# Patient Record
Sex: Male | Born: 1990 | Race: White | Hispanic: No | Marital: Single | Smoking: Never smoker
Health system: Southern US, Community
[De-identification: ages and names within clinical notes are randomized; demographics above are authoritative.]

## PROBLEM LIST (undated history)

## (undated) DIAGNOSIS — S82202A Unspecified fracture of shaft of left tibia, initial encounter for closed fracture: Secondary | ICD-10-CM

## (undated) DIAGNOSIS — S62102A Fracture of unspecified carpal bone, left wrist, initial encounter for closed fracture: Secondary | ICD-10-CM

## (undated) HISTORY — DX: Unspecified fracture of shaft of left tibia, initial encounter for closed fracture: S82.202A

## (undated) HISTORY — DX: Fracture of unspecified carpal bone, left wrist, initial encounter for closed fracture: S62.102A

---

## 2013-07-25 ENCOUNTER — Encounter: Payer: Self-pay | Admitting: Nurse Practitioner

## 2013-07-25 ENCOUNTER — Ambulatory Visit (INDEPENDENT_AMBULATORY_CARE_PROVIDER_SITE_OTHER): Payer: BC Managed Care – PPO | Admitting: Nurse Practitioner

## 2013-07-25 VITALS — BP 120/80 | HR 65 | Temp 97.4°F | Ht 69.5 in | Wt 157.0 lb

## 2013-07-25 DIAGNOSIS — M238X1 Other internal derangements of right knee: Secondary | ICD-10-CM

## 2013-07-25 DIAGNOSIS — Z0000 Encounter for general adult medical examination without abnormal findings: Secondary | ICD-10-CM

## 2013-07-25 DIAGNOSIS — R29898 Other symptoms and signs involving the musculoskeletal system: Secondary | ICD-10-CM

## 2013-07-25 DIAGNOSIS — Z23 Encounter for immunization: Secondary | ICD-10-CM

## 2013-07-25 DIAGNOSIS — R1011 Right upper quadrant pain: Secondary | ICD-10-CM

## 2013-07-25 LAB — RENAL FUNCTION PANEL
CO2: 31 mEq/L (ref 19–32)
Calcium: 9.4 mg/dL (ref 8.4–10.5)
Creatinine, Ser: 0.9 mg/dL (ref 0.4–1.5)
GFR: 108.91 mL/min (ref >60.00)
Glucose, Bld: 81 mg/dL (ref 70–99)
Phosphorus: 2.9 mg/dL (ref 2.3–4.6)
Potassium: 3.9 mEq/L (ref 3.5–5.1)
Sodium: 138 mEq/L (ref 135–145)

## 2013-07-25 LAB — HEPATIC FUNCTION PANEL
ALT: 24 U/L (ref 0–53)
AST: 16 U/L (ref 0–37)
Bilirubin, Direct: 0.2 mg/dL (ref 0.0–0.3)
Total Bilirubin: 2.7 mg/dL — ABNORMAL HIGH (ref 0.3–1.2)

## 2013-07-25 LAB — CBC
HCT: 41.2 % (ref 39.0–52.0)
MCV: 87.8 fl (ref 78.0–100.0)
Platelets: 214 10*3/uL (ref 150.0–400.0)
RBC: 4.7 Mil/uL (ref 4.22–5.81)
RDW: 12.4 % (ref 11.5–14.6)

## 2013-07-25 MED ORDER — TETANUS-DIPHTH-ACELL PERTUSSIS 5-2.5-18.5 LF-MCG/0.5 IM SUSP: 0.5000 mL | Freq: Once | INTRAMUSCULAR | Status: DC

## 2013-07-25 NOTE — Progress Notes (Signed)
Subjective:     Vincent May is a 22 y.o. male and is here for a comprehensive physical exam. The patient reports RLQ abd pain about 3 mo. Ago that was the second episode-the first one occuring 9 mos ago w/sudden onset and lasting about 24 hours. He denies associated symptoms such as nausea, diarrhea, gas, belching, heartburn, or constipation. Additionally, he c/o crepitus in R knee & pain w/walking downhill. He would like to be screened for HIV as his brother's girlfriend lived with his family 2 years before she died of complications of HIV.  History   Social History  . Marital Status: Single    Spouse Name: N/A    Number of Children: N/A  . Years of Education: N/A   Occupational History  . Not on file.   Social History Main Topics  . Smoking status: Never Smoker   . Smokeless tobacco: Never Used  . Alcohol Use: Yes  . Drug Use: No  . Sexual Activity: Not on file   Other Topics Concern  . Not on file   Social History Narrative  . No narrative on file   No health maintenance topics applied.  The following portions of the patient's history were reviewed and updated as appropriate: allergies, current medications, past family history, past medical history, past social history, past surgical history and problem list.  Review of Systems Constitutional: negative Eyes: positive for contacts/glasses and had eye exam 1 mo ago. Ears, nose, mouth, throat, and face: reports multiple cavities, a root canal Respiratory: negative Cardiovascular: negative Gastrointestinal: positive for remote history of abd pain, noted in HPI, negative for change in bowel habits, constipation, diarrhea, dyspepsia, melena, nausea, reflux symptoms and vomiting Integument/breast: spends a lot of time in sun. Musculoskeletal:positive for crepitus in R knee, pain w/walking downhill Neurological: negative Behavioral/Psych: negative for excessive alcohol consumption, illegal drug usage, sleep disturbance and  tobacco use Endocrine: negative Allergic/Immunologic: negative   Objective:    BP 120/80  Pulse 65  Temp(Src) 97.4 F (36.3 C) (Oral)  Ht 5' 9.5" (1.765 m)  Wt 157 lb (71.215 kg)  BMI 22.86 kg/m2  SpO2 98% General appearance: alert, cooperative, appears stated age and no distress Head: Normocephalic, without obvious abnormality, atraumatic Eyes: negative findings: lids and lashes normal, conjunctivae and sclerae normal, corneas clear and pupils equal, round, reactive to light and accomodation Ears: normal TM's and external ear canals both ears and cerumen in R canal Throat: lips, mucosa, and tongue normal; teeth and gums normal and missing a molar Lower L Neck: no adenopathy, no carotid bruit, supple, symmetrical, trachea midline and thyroid not enlarged, symmetric, no tenderness/mass/nodules Back: symmetric, no curvature. ROM normal. No CVA tenderness. Lungs: clear to auscultation bilaterally Heart: regular rate and rhythm, S1, S2 normal, no murmur, click, rub or gallop Abdomen: soft, non-tender; bowel sounds normal; no masses,  no organomegaly Extremities: extremities normal, atraumatic, no cyanosis or edema Pulses: 2+ and symmetric Skin: Skin color, texture, turgor normal. No rashes or lesions Lymph nodes: Cervical, supraclavicular, and axillary nodes normal. Neurologic: Alert and oriented X 3, normal strength and tone. Normal symmetric reflexes. Normal coordination and gait    Assessment:    Healthy male exam. Prev care Needs flu & Tdap, screening labs. Traveling to [place] in 2 mos. Crepitus R knee, no laxity in joint Remote Hx recurrent abd pain X 2     Plan:    1 Tdap & flu administered. HIV screen. Consult CDC for travel info. Will call pt.  2 ref to ortho 3 CBC, renal, hep, ua micro  See After Visit Summary for Counseling Recommendations

## 2013-07-25 NOTE — Patient Instructions (Addendum)
Our office will call you with results of labs. Please let us know if abdominal pain returns or you need [place] for other symptoms. Otherwise we will see you again for preventive care 2-5 years.   Preventive Care for Adults, Male A healthy lifestyle and preventive care can promote health and wellness. Preventive health guidelines for men include the following key practices:  A routine yearly physical is a good way to check with your caregiver about your health and preventative screening. It is a chance to share any concerns and updates on your health, and to receive a thorough exam.  Visit your dentist for a routine exam and preventative care every 6 months. Brush your teeth twice a day and floss once a day. Good oral hygiene prevents tooth decay and gum disease.  The frequency of eye exams is based on your age, health, family medical history, use of contact lenses, and other factors. Follow your caregiver's recommendations for frequency of eye exams.  Eat a healthy diet. Foods like vegetables, fruits, whole grains, low-fat dairy products, and lean protein foods contain the nutrients you need without too many calories. Decrease your intake of foods high in solid fats, added sugars, and salt. Eat the right amount of calories for you.Get information about a proper diet from your caregiver, if necessary.  Regular physical exercise is one of the most important things you can do for your health. Most adults should get at least 150 minutes of moderate-intensity exercise (any activity that increases your heart rate and causes you to sweat) each week. In addition, most adults need muscle-strengthening exercises on 2 or more days a week.  Maintain a healthy weight. The body mass index (BMI) is a screening tool to identify possible weight problems. It provides an estimate of body fat based on height and weight. Your caregiver can help determine your BMI, and can help you achieve or maintain a healthy weight.For  adults 20 years and older:  A BMI below 18.5 is considered underweight.  A BMI of 18.5 to 24.9 is normal.  A BMI of 25 to 29.9 is considered overweight.  A BMI of 30 and above is considered obese.  Maintain normal blood lipids and cholesterol levels by exercising and minimizing your intake of saturated fat. Eat a balanced diet with plenty of fruit and vegetables. Blood tests for lipids and cholesterol should begin at age 2 and be repeated every 5 years. If your lipid or cholesterol levels are high, you are over 50, or you are a high risk for heart disease, you may need your cholesterol levels checked more frequently.Ongoing high lipid and cholesterol levels should be treated with medicines if diet and exercise are not effective.  If you smoke, find out from your caregiver how to quit. If you do not use tobacco, do not start.  If you choose to drink alcohol, do not exceed 2 drinks per day. One drink is considered to be 12 ounces (355 mL) of beer, 5 ounces (148 mL) of wine, or 1.5 ounces (44 mL) of liquor.  Avoid use of street drugs. Do not share needles with anyone. Ask for help if you need support or instructions about stopping the use of drugs.  High blood pressure causes heart disease and increases the risk of stroke. Your blood pressure should be checked at least every 1 to 2 years. Ongoing high blood pressure should be treated with medicines, if weight loss and exercise are not effective.  If you are 45 to 24  years old, ask your caregiver if you should take aspirin to prevent heart disease.  Diabetes screening involves taking a blood sample to check your fasting blood sugar level. This should be done once every 3 years, after age 59, if you are within normal weight and without risk factors for diabetes. Testing should be considered at a younger age or be carried out more frequently if you are overweight and have at least 1 risk factor for diabetes.  Colorectal cancer can be detected and  often prevented. Most routine colorectal cancer screening begins at the age of 32 and continues through age 44. However, your caregiver may recommend screening at an earlier age if you have risk factors for colon cancer. On a yearly basis, your caregiver may provide home test kits to check for hidden blood in the stool. Use of a small camera at the end of a tube, to directly examine the colon (sigmoidoscopy or colonoscopy), can detect the earliest forms of colorectal cancer. Talk to your caregiver about this at age 52, when routine screening begins. Direct examination of the colon should be repeated every 5 to 10 years through age 41, unless early forms of pre-cancerous polyps or small growths are found.  Hepatitis C blood testing is recommended for all people born from [birth year] through 1965 and any individual with known risks for hepatitis C.  Practice safe sex. Use condoms and avoid high-risk sexual practices to reduce the spread of sexually transmitted infections (STIs). STIs include gonorrhea, chlamydia, syphilis, trichomonas, herpes, HPV, and human immunodeficiency virus (HIV). Herpes, HIV, and HPV are viral illnesses that have no cure. They can result in disability, cancer, and death.  A one-time screening for abdominal aortic aneurysm (AAA) and surgical repair of large AAAs by sound wave imaging (ultrasonography) is recommended for ages 12 to 65 years who are current or former smokers.  Healthy men should no longer receive prostate-specific antigen (PSA) blood tests as part of routine cancer screening. Consult with your caregiver about prostate cancer screening.  Testicular cancer screening is not recommended for adult males who have no symptoms. Screening includes self-exam, caregiver exam, and other screening tests. Consult with your caregiver about any symptoms you have or any concerns you have about testicular cancer.  Use sunscreen with skin protection factor (SPF) of 30 or more. Apply sunscreen  liberally and repeatedly throughout the day. You should seek shade when your shadow is shorter than you. Protect yourself by wearing long sleeves, pants, a wide-brimmed hat, and sunglasses year round, whenever you are outdoors.  Once a month, do a whole body skin exam, using a mirror to look at the skin on your back. Notify your caregiver of new moles, moles that have irregular borders, moles that are larger than a pencil eraser, or moles that have changed in shape or color.  Stay current with required immunizations.  Influenza. You need a dose every fall (or winter). The composition of the flu vaccine changes each year, so being vaccinated once is not enough.  Pneumococcal polysaccharide. You need 1 to 2 doses if you smoke cigarettes or if you have certain chronic medical conditions. You need 1 dose at age 21 (or older) if you have never been vaccinated.  Tetanus, diphtheria, pertussis (Tdap, Td). Get 1 dose of Tdap vaccine if you are younger than age 71 years, are over 52 and have contact with an infant, are a Research scientist (physical sciences), or simply want to be protected from whooping cough. After that, you need a Td  booster dose every 10 years. Consult your caregiver if you have not had at least 3 tetanus and diphtheria-containing shots sometime in your life or have a deep or dirty wound.  HPV. This vaccine is recommended for males 13 through 24 years of age. This vaccine may be given to men 22 through 24 years of age who have not completed the 3 dose series. It is recommended for men through age 34 who have sex with men or whose immune system is weakened because of HIV infection, other illness, or medications. The vaccine is given in 3 doses over 6 months.  Measles, mumps, rubella (MMR). You need at least 1 dose of MMR if you were born in 1957 or later. You may also need a 2nd dose.  Meningococcal. If you are age 71 to 63 years and a Orthoptist living in a residence hall, or have one of  several medical conditions, you need to get vaccinated against meningococcal disease. You may also need additional booster doses.  Zoster (shingles). If you are age 51 years or older, you should get this vaccine.  Varicella (chickenpox). If you have never had chickenpox or you were vaccinated but received only 1 dose, talk to your caregiver to find out if you need this vaccine.  Hepatitis A. You need this vaccine if you have a specific risk factor for hepatitis A virus infection, or you simply wish to be protected from this disease. The vaccine is usually given as 2 doses, 6 to 18 months apart.  Hepatitis B. You need this vaccine if you have a specific risk factor for hepatitis B virus infection or you simply wish to be protected from this disease. The vaccine is given in 3 doses, usually over 6 months. Preventative Service / Frequency Ages 38 to 38  Blood pressure check.** / Every 1 to 2 years.  Lipid and cholesterol check.** / Every 5 years beginning at age 79.  Hepatitis C blood test.** / For any individual with known risks for hepatitis C.  Skin self-exam. / Monthly.  Influenza immunization.** / Every year.  Pneumococcal polysaccharide immunization.** / 1 to 2 doses if you smoke cigarettes or if you have certain chronic medical conditions.  Tetanus, diphtheria, pertussis (Tdap,Td) immunization. / A one-time dose of Tdap vaccine. After that, you need a Td booster dose every 10 years.  HPV immunization. / 3 doses over 6 months, if 26 and younger.  Measles, mumps, rubella (MMR) immunization. / You need at least 1 dose of MMR if you were born in 1957 or later. You may also need a 2nd dose.  Meningococcal immunization. / 1 dose if you are age 23 to 5 years and a Orthoptist living in a residence hall, or have one of several medical conditions, you need to get vaccinated against meningococcal disease. You may also need additional booster doses.  Varicella immunization.**  / Consult your caregiver.  Hepatitis A immunization.** / Consult your caregiver. 2 doses, 6 to 18 months apart.  Hepatitis B immunization.** / Consult your caregiver. 3 doses usually over 6 months. Ages 58 to 20  Blood pressure check.** / Every 1 to 2 years.  Lipid and cholesterol check.** / Every 5 years beginning at age 44.  Fecal occult blood test (FOBT) of stool. / Every year beginning at age 44 and continuing until age 29. You may not have to do this test if you get colonoscopy every 10 years.  Flexible sigmoidoscopy** or colonoscopy.** / Every 5  years for a flexible sigmoidoscopy or every 10 years for a colonoscopy beginning at age 90 and continuing until age 29.  Hepatitis C blood test.** / For all people born from [birth year] through 1965 and any individual with known risks for hepatitis C.  Skin self-exam. / Monthly.  Influenza immunization.** / Every year.  Pneumococcal polysaccharide immunization.** / 1 to 2 doses if you smoke cigarettes or if you have certain chronic medical conditions.  Tetanus, diphtheria, pertussis (Tdap/Td) immunization.** / A one-time dose of Tdap vaccine. After that, you need a Td booster dose every 10 years.  Measles, mumps, rubella (MMR) immunization. / You need at least 1 dose of MMR if you were born in 1957 or later. You may also need a 2nd dose.  Varicella immunization.**/ Consult your caregiver.  Meningococcal immunization.** / Consult your caregiver.  Hepatitis A immunization.** / Consult your caregiver. 2 doses, 6 to 18 months apart.  Hepatitis B immunization.** / Consult your caregiver. 3 doses, usually over 6 months. Ages 15 and over  Blood pressure check.** / Every 1 to 2 years.  Lipid and cholesterol check.**/ Every 5 years beginning at age 63.  Fecal occult blood test (FOBT) of stool. / Every year beginning at age 40 and continuing until age 22. You may not have to do this test if you get colonoscopy every 10 years.  Flexible  sigmoidoscopy** or colonoscopy.** / Every 5 years for a flexible sigmoidoscopy or every 10 years for a colonoscopy beginning at age 25 and continuing until age 49.  Hepatitis C blood test.** / For all people born from [birth year] through 1967 and any individual with known risks for hepatitis C.  Abdominal aortic aneurysm (AAA) screening.** / A one-time screening for ages 57 to 52 years who are current or former smokers.  Skin self-exam. / Monthly.  Influenza immunization.** / Every year.  Pneumococcal polysaccharide immunization.** / 1 dose at age 81 (or older) if you have never been vaccinated.  Tetanus, diphtheria, pertussis (Tdap, Td) immunization. / A one-time dose of Tdap vaccine if you are over 65 and have contact with an infant, are a Research scientist (physical sciences), or simply want to be protected from whooping cough. After that, you need a Td booster dose every 10 years.  Varicella immunization. ** / Consult your caregiver.  Meningococcal immunization.** / Consult your caregiver.  Hepatitis A immunization. ** / Consult your caregiver. 2 doses, 6 to 18 months apart.  Hepatitis B immunization.** / Check with your caregiver. 3 doses, usually over 6 months. **Family history and personal history of risk and conditions may change your caregiver's recommendations. Document Released: 12/12/2001 Document Revised: 01/08/2012 Document Reviewed: 03/13/2011 [facility] Patient Information 2014 [place], [place].

## 2013-07-26 LAB — URINALYSIS, MICROSCOPIC ONLY: Squamous Epithelial / LPF: NONE SEEN

## 2013-07-28 ENCOUNTER — Telehealth: Payer: Self-pay | Admitting: Nurse Practitioner

## 2013-07-28 ENCOUNTER — Ambulatory Visit (HOSPITAL_BASED_OUTPATIENT_CLINIC_OR_DEPARTMENT_OTHER)
Admission: RE | Admit: 2013-07-28 | Discharge: 2013-07-28 | Disposition: A | Discharge status: Home / Self Care | Payer: BC Managed Care – PPO | Source: Ambulatory Visit | Attending: Nurse Practitioner | Admitting: Nurse Practitioner

## 2013-07-28 DIAGNOSIS — R17 Unspecified jaundice: Secondary | ICD-10-CM | POA: Insufficient documentation

## 2013-07-28 DIAGNOSIS — R1011 Right upper quadrant pain: Secondary | ICD-10-CM | POA: Insufficient documentation

## 2013-07-28 NOTE — Telephone Encounter (Signed)
Patient notified of results.

## 2013-07-28 NOTE — Telephone Encounter (Signed)
Patient is scheduled for [place] today 07/28/13. Patient has been advised. I also advised patient that we were checking on which vaccines he would need to travel. Marcelino Duster please give patient Vincent May travel clinic phone # [phone] when you contact him.

## 2013-07-28 NOTE — Telephone Encounter (Signed)
LMOVM for patient to return call.

## 2013-07-28 NOTE — Telephone Encounter (Signed)
Elevated bilirubin, pt c/o RUQ pain, intermittent for 8 mos. Will order [place] for GB, cystic duct, CBD, & liver eval. Pt will travel to [place], recmd Hep A and typhoid vaccines. May need malaria meds if traveling to [place] of [place], per [employer] travel info. HIV neg, CBC, liver & kidney func., & UA micro nml. Should check lipids as bilirubin elevated.

## 2013-07-30 ENCOUNTER — Telehealth: Payer: Self-pay | Admitting: Nurse Practitioner

## 2013-07-30 DIAGNOSIS — R109 Unspecified abdominal pain: Secondary | ICD-10-CM

## 2013-07-30 NOTE — Telephone Encounter (Signed)
Pt reports recent episode of pain after eating, episode lasting 48 hours. Appendicitis? Recent upper abd [place] nml, elevated bilirubin. Will ref to GI for further work up.

## 2013-07-30 NOTE — Telephone Encounter (Signed)
Message copied by Maximino Sarin [facility] on Wed Jul 30, 2013  9:32 PM ------      Message from: Marlene Lard      Created: Wed Jul 30, 2013  9:28 AM       Patient notified of results. Patient stated that Sunday night 07/27/13, he had pizza and later that night had had severe pain in his upper stomach that traveled down his right side. Patient stated that the pain stay in his lower right side and hip area until yesterday 07/29/13. Patient wanted me to let you know about this episode. ------

## 2013-07-31 NOTE — Telephone Encounter (Signed)
Patient returned call and was notified of GI referral. Patient stated that he will be out of town until 08/05/13.

## 2013-07-31 NOTE — Telephone Encounter (Signed)
Called patient to let him know that he is being referred to GI. LMOVM for patient to return call.

## 2013-08-29 ENCOUNTER — Encounter: Payer: BC Managed Care – PPO | Admitting: Internal Medicine

## 2013-08-29 ENCOUNTER — Encounter: Payer: Self-pay | Admitting: Family Medicine

## 2013-08-29 ENCOUNTER — Ambulatory Visit (INDEPENDENT_AMBULATORY_CARE_PROVIDER_SITE_OTHER): Payer: BC Managed Care – PPO | Admitting: Family Medicine

## 2013-08-29 VITALS — BP 137/77 | HR 60 | Temp 97.8°F | Resp 16 | Ht 69.5 in | Wt 158.0 lb

## 2013-08-29 DIAGNOSIS — Z23 Encounter for immunization: Secondary | ICD-10-CM

## 2013-08-29 DIAGNOSIS — Z7189 Other specified counseling: Secondary | ICD-10-CM

## 2013-08-29 DIAGNOSIS — Z7184 Encounter for health counseling related to travel: Secondary | ICD-10-CM

## 2013-08-29 MED ORDER — TYPHOID VACCINE PO CPDR: DELAYED_RELEASE_CAPSULE | ORAL | Status: DC

## 2013-08-29 NOTE — Progress Notes (Signed)
OFFICE NOTE  08/29/2013  CC:  Chief Complaint  Patient presents with  . Immunizations     HPI: Patient is a 22 y.o. Caucasian male who is here for traveler's health guidance visit. He established care with [place] through NP Maximino Sarin and this is the first time I'm seeing him. He is getting married in November '14 and going to [place] in December 2014.  He is feeling well, no complaints.  Pertinent PMH:  No past medical history on file.  No past surgical history on file.  History   Social History Narrative   Pt lives in [place] with fiance's parents.   He is engaged-will be getting married in few months.    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   Originally from [place], relocated 2009 to go to [facility] [place].   No T/A/Ds.          MEDS:  NONE  PE: Blood pressure 137/77, pulse 60, temperature 97.8 F (36.6 C), temperature source Temporal, resp. rate 16, height 5' 9.5" (1.765 m), weight 158 lb (71.668 kg), SpO2 100.00%. Gen: Alert, well appearing.  Patient is oriented to person, place, time, and situation. No further exam today.  IMPRESSION AND PLAN:  Healthy traveler: reviewed CDC's recommendations regarding travel to [place] with pt today. He is UTD on flu and Tdap. He got Hep A #1 today, needs #2 in 6 mo to be fully vaccinated. Typhoid vaccine caps rx'd today. Avoidance of mosquitos/bites is recommended--CDC does not recommend malaria prophylaxis or yellow fever vaccine.  FOLLOW UP: prn

## 2013-09-29 ENCOUNTER — Encounter: Payer: Self-pay | Admitting: Family Medicine

## 2014-05-25 IMAGING — US US ABDOMEN COMPLETE
1 series · 14 of 25 positions shown · non-contrast
Comparison: None.

CLINICAL DATA: Intermittent right upper quadrant pain for 8 months
with elevated bilirubin

ABDOMINAL ULTRASOUND COMPLETE

[Series 1: us abdomen complete · 0.28mm/px · 14 of 85 slices shown]
[im 1/85]
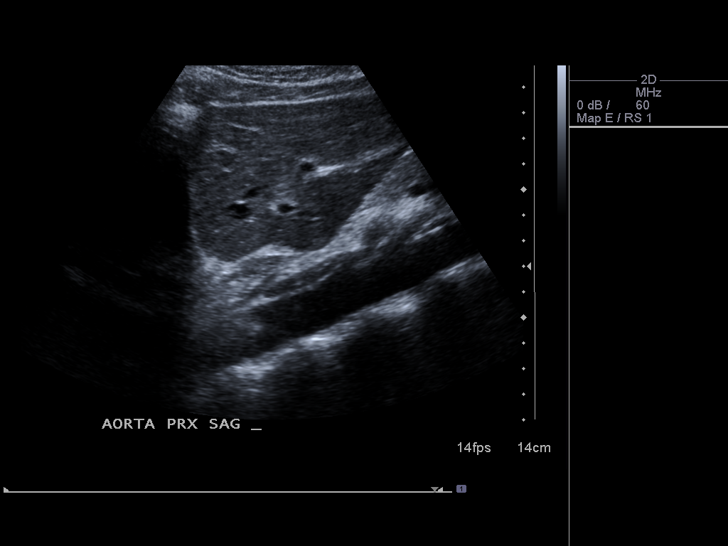
[im 8/85]
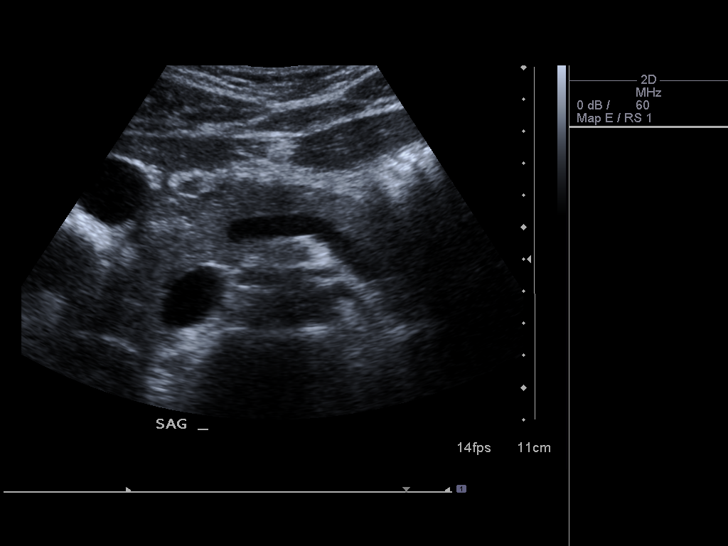
[im 15/85]
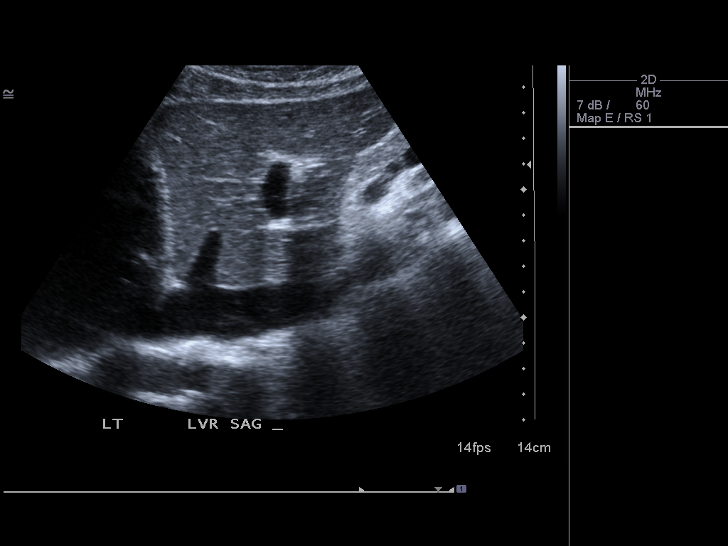
[im 22/85]
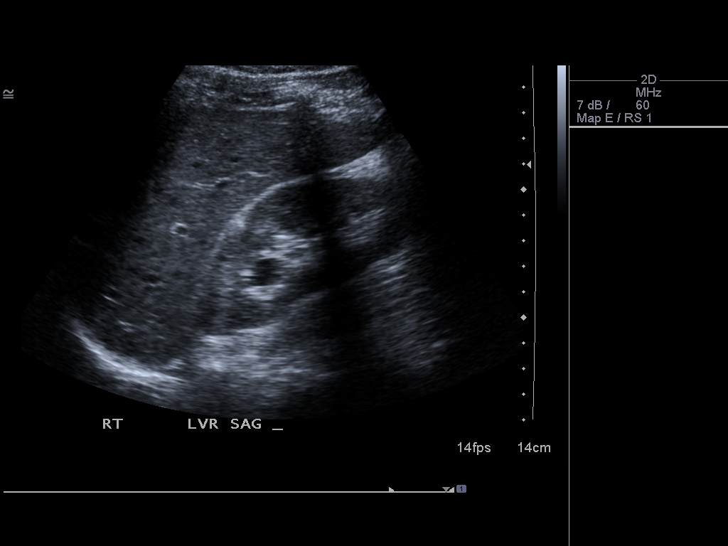
[im 29/85]
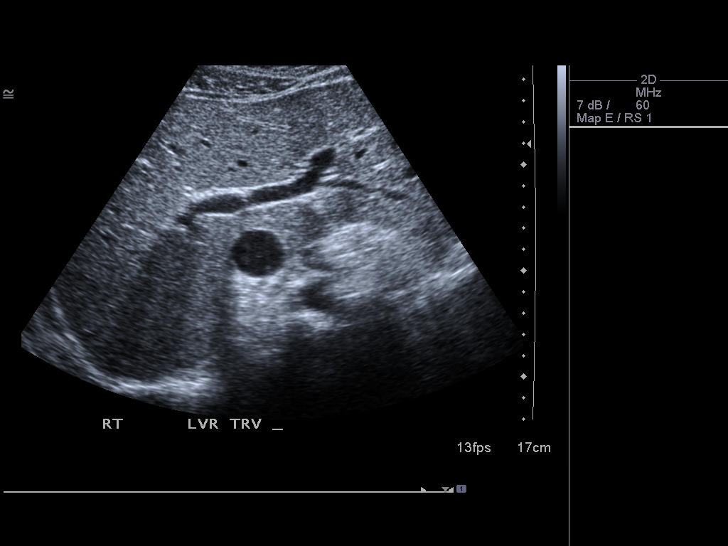
[im 32/85]
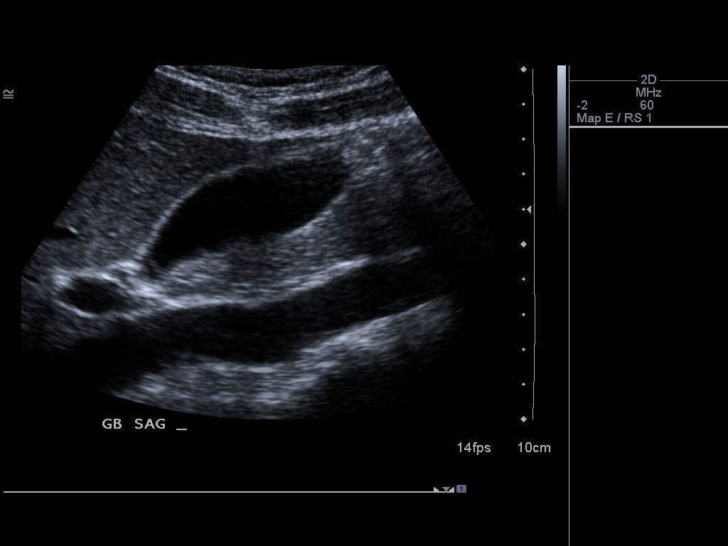
[im 39/85]
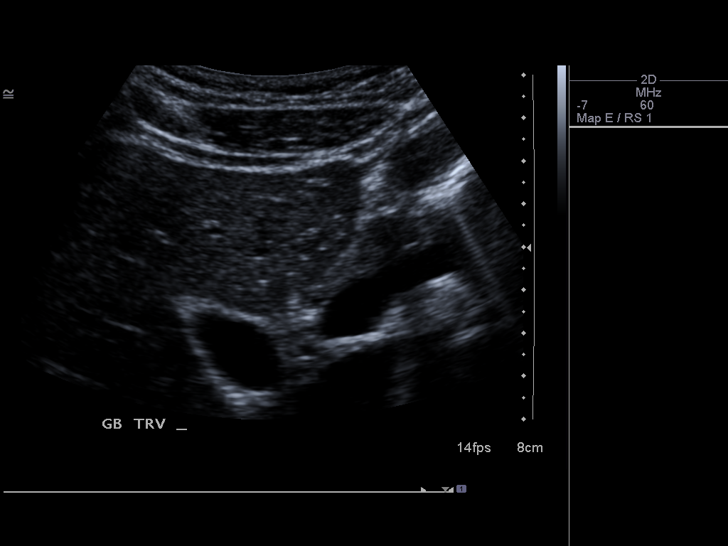
[im 46/85]
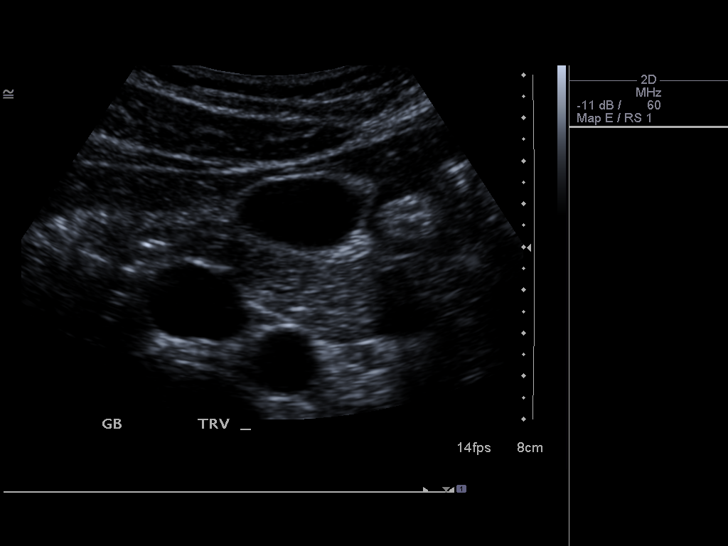
[im 53/85]
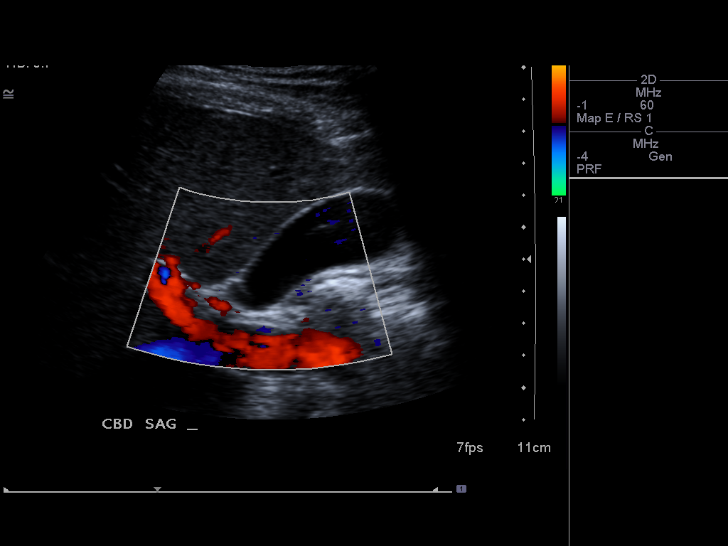
[im 57/85]
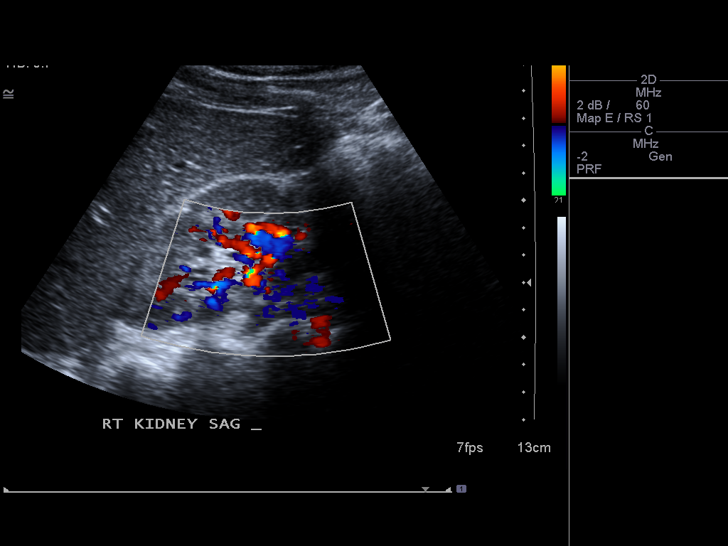
[im 64/85]
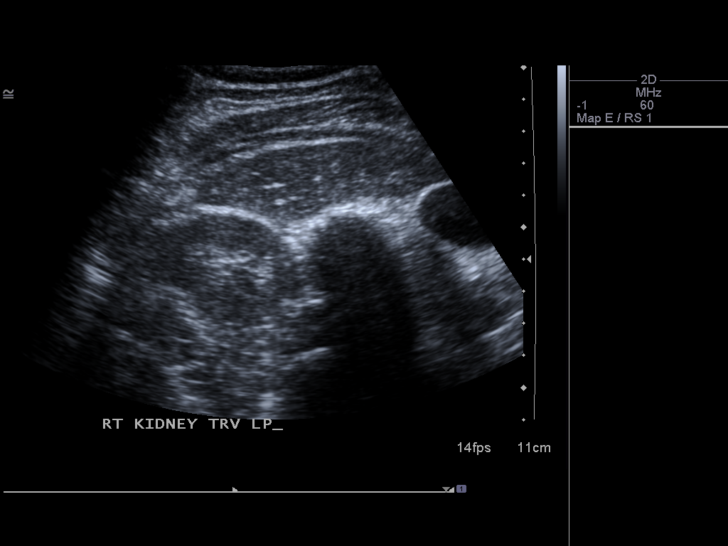
[im 71/85]
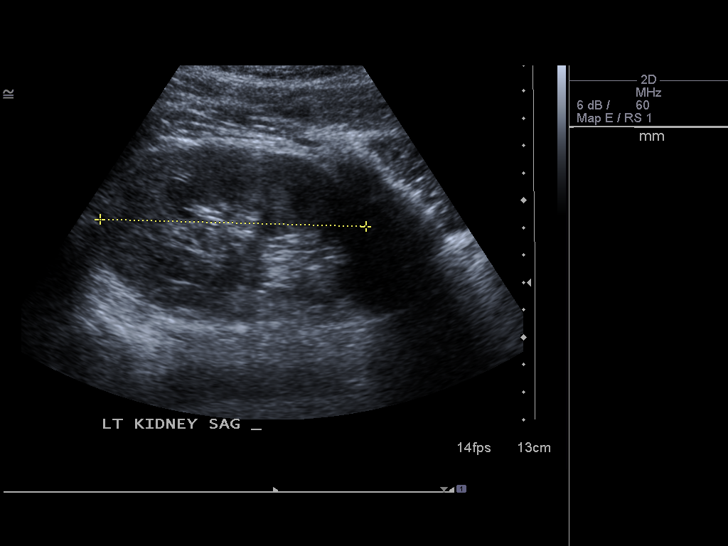
[im 78/85]
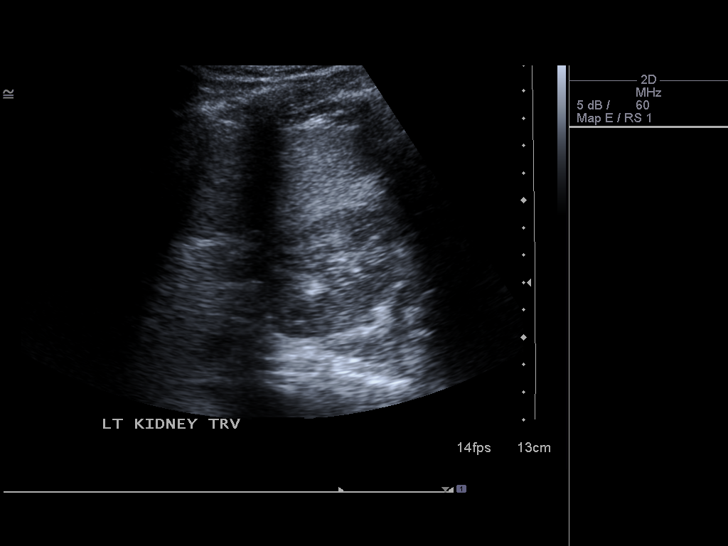
[im 85/85]
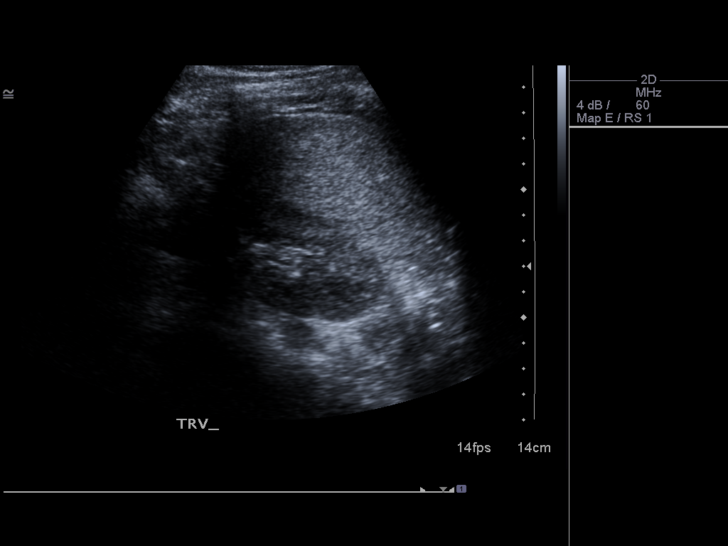

[14 of 25 positions shown; findings below may reference images not displayed]

FINDINGS: Gallbladder:  No gallstones, gallbladder wall thickening, or
pericholecystic fluid.

Common Bile Duct:  Within normal limits in caliber.

Liver: No focal mass lesion identified.  Within normal limits in
parenchymal echogenicity.

IVC:  Limited evaluation, appears grossly normal

Pancreas:  No abnormality identified.

Spleen:  Within normal limits in size and echotexture.

Right kidney:  1.7 cm cyst upper pole, otherwise normal

Left kidney:  Normal in size and parenchymal echogenicity.  No
evidence of mass or hydronephrosis.

Abdominal Aorta:  Limited evaluation, no evidence of dilatation
seen
IMPRESSION: Negative abdominal ultrasound.

## 2015-06-08 ENCOUNTER — Ambulatory Visit (INDEPENDENT_AMBULATORY_CARE_PROVIDER_SITE_OTHER): Payer: BLUE CROSS/BLUE SHIELD | Admitting: Family Medicine

## 2015-06-08 ENCOUNTER — Encounter: Payer: Self-pay | Admitting: Family Medicine

## 2015-06-08 VITALS — BP 127/79 | HR 87 | Temp 99.2°F | Resp 16 | Ht 69.5 in | Wt 154.0 lb

## 2015-06-08 DIAGNOSIS — Z9189 Other specified personal risk factors, not elsewhere classified: Secondary | ICD-10-CM

## 2015-06-08 DIAGNOSIS — H6690 Otitis media, unspecified, unspecified ear: Secondary | ICD-10-CM | POA: Insufficient documentation

## 2015-06-08 DIAGNOSIS — H918X2 Other specified hearing loss, left ear: Secondary | ICD-10-CM | POA: Diagnosis not present

## 2015-06-08 DIAGNOSIS — H6122 Impacted cerumen, left ear: Secondary | ICD-10-CM

## 2015-06-08 DIAGNOSIS — H6121 Impacted cerumen, right ear: Secondary | ICD-10-CM

## 2015-06-08 DIAGNOSIS — H6692 Otitis media, unspecified, left ear: Secondary | ICD-10-CM | POA: Diagnosis not present

## 2015-06-08 DIAGNOSIS — M2550 Pain in unspecified joint: Secondary | ICD-10-CM | POA: Diagnosis not present

## 2015-06-08 DIAGNOSIS — R509 Fever, unspecified: Secondary | ICD-10-CM

## 2015-06-08 MED ORDER — AMOXICILLIN-POT CLAVULANATE 875-125 MG PO TABS: 1.0000 | ORAL_TABLET | Freq: Two times a day (BID) | ORAL | Status: DC

## 2015-06-08 NOTE — Progress Notes (Signed)
Pre visit review using our clinic review tool, if applicable. No additional management support is needed unless otherwise documented below in the visit note. 

## 2015-06-08 NOTE — Progress Notes (Signed)
Subjective:    Patient ID: Vincent May, male    DOB: [birth year], 24 y.o.   MRN: [id number]  HPI  Ear pain: Patient presents for acute visit with complaints of runny nose, clogged painful ears bilaterally, left greater than right. Patient stated he and his wife was vacationing in [place] when he noticed his symptoms starting 2 days ago. Patient stated on his plane ride yesterday, both ears felt clogged and was became more painful. He noticed a subjective temperature, felt feverish and chills, and Bones felt achy. Endorses muscle achiness. Patient reports a mosquito bite on his ankle, he is uncertain timing of bite.  Patient did snorkel during his vacation, but he denies any pain in his ears after snorkeling. He denies nausea, vomit, diarrhea, sore throat, rash, hematuria, bruising or headache. He endorses rhinorrhea, mild cough, subjective fever, chills, arthralgias and myalgias. Patient states the he was using peroxide 1 in his ears. He's noticed no increased drainage of his ears, but noticed some mild hearing loss in the right ear. He did take a sinus medication today. He is tolerating food and liquid. His appetite is normal. His wife has noticed some mild hoarseness to his voice. He and his wife are attempting to become pregnant, and he is worried about the Zika virus. Patient did not receive the flu vaccination last year. He is up-to-date on his tetanus vaccination. He is up-to-date on his HIV screening. Hepatitis A given as an adult 2014. Typhoid vaccination in 2014.  Never Smoker  Past Medical History  Diagnosis Date  . Fracture of left tibia     spiral, age 33  . Left wrist fracture    No Known Allergies No past surgical history on file. Family History  Problem Relation Age of Onset  . Hypertension Mother    Social: Married, non smoker Review of Systems  Constitutional: Positive for fever and chills. Negative for diaphoresis, activity change, appetite change, fatigue and  unexpected weight change.  HENT: Positive for congestion, ear pain, hearing loss, postnasal drip, rhinorrhea, sinus pressure and voice change. Negative for nosebleeds, sore throat and tinnitus.   Eyes: Negative.   Respiratory: Negative for cough and wheezing.   Cardiovascular: Negative for chest pain and palpitations.  Gastrointestinal: Negative for nausea, vomiting, diarrhea, constipation and blood in stool.  Genitourinary: Negative.   Musculoskeletal: Positive for myalgias.  Neurological: Negative for dizziness, weakness and headaches.      Objective:   Physical Exam  BP 127/79 mmHg  Pulse 87  Temp(Src) 99.2 F (37.3 C) (Temporal)  Resp 16  Ht 5' 9.5" (1.765 m)  Wt 154 lb (69.854 kg)  BMI 22.42 kg/m2  SpO2 98% Gen: NAD. Nontoxic in apperance. Well-developed, well-nourished, Caucasian male, makes good eye contact, smiles. HEENT: AT. [place]. Bilateral TM visualized, with right cerumen impaction initially, after right ear irrigation right tympanic membrane bulging with erythema. Left tympanic membrane bulging with erythema. Bilateral eyes without injections, drainage or icterus. MMM. Bilateral nares moderate erythema, no swelling. Throat without no erythema, no exudates. CV: RRR no murmur Chest: CTAB, no wheeze or crackles MSK: No erythema, no edema, no soft tissue swelling, no effusions. Tenderness to palpation. Normal range of motion. Neurovascularly intact distally. Skin: No rash, petechiae or purpura.  Assessment & Plan:  Vincent May is a 24 y.o. with recent travel to [place], presents for a acute office visit for possible viral versus bacterial illness. - Bilateral erythema and bulging of TM, no perforation. Will treat with  Augmentin, maybe viral, but patient with systemic symptoms and fever.  F/u 2 weeks, sooner if no improvement - Patient with signs and symptoms concerning for possible mosquito borne viral infectious disease. Patient and his wife are attempting to become  pregnant, so this is concerning for Zika virus. There has been cases of Zika virus in [place] by [employer]. Patient's symptoms are arthralgias, myalgias and fever. He also has ear pain, and exam is consistent with erythema bilaterally with bulging tympanic membranes. Patient's onset of symptoms is 2 days. - Contacted [facility] health Department, and reviewed [employer], both recommending testing for dengue, Chikungunya and Zika.  - Patient advised to either abstain from sexual contact or use a condom until results are returned. If Zika positive will need to have wife tested and patient will need to use a condom for 6 months.  - Will obtain CBC with differential, Zika, Dengue, Chikungunya. [facility], patient will need to go to [address] to have these drawn.  25 minutes or more of face to face counseling with the patient.

## 2015-06-08 NOTE — Patient Instructions (Signed)
I have prescribed you an antibiotic for ear infection.  Rest, advil for fever/pain, mucinex for runny nose, hydrate.

## 2015-06-09 ENCOUNTER — Telehealth: Payer: Self-pay | Admitting: Family Medicine

## 2015-06-09 ENCOUNTER — Encounter: Payer: Self-pay | Admitting: Family Medicine

## 2015-06-09 DIAGNOSIS — R509 Fever, unspecified: Secondary | ICD-10-CM | POA: Insufficient documentation

## 2015-06-09 DIAGNOSIS — H6122 Impacted cerumen, left ear: Secondary | ICD-10-CM | POA: Insufficient documentation

## 2015-06-09 DIAGNOSIS — Z9189 Other specified personal risk factors, not elsewhere classified: Secondary | ICD-10-CM | POA: Insufficient documentation

## 2015-06-09 DIAGNOSIS — M2550 Pain in unspecified joint: Secondary | ICD-10-CM | POA: Insufficient documentation

## 2015-06-09 LAB — CBC WITH DIFFERENTIAL/PLATELET
Basophils Absolute: 0 10*3/uL (ref 0.0–0.1)
Basophils Relative: 0 % (ref 0–1)
Eosinophils Absolute: 0.3 10*3/uL (ref 0.0–0.7)
Eosinophils Relative: 5 % (ref 0–5)
HEMATOCRIT: 40 % (ref 39.0–52.0)
Hemoglobin: 13.4 g/dL (ref 13.0–17.0)
LYMPHS ABS: 1.2 10*3/uL (ref 0.7–4.0)
LYMPHS PCT: 21 % (ref 12–46)
MCH: 29.5 pg (ref 26.0–34.0)
MCHC: 33.5 g/dL (ref 30.0–36.0)
MCV: 87.9 fL (ref 78.0–100.0)
MPV: 11.4 fL (ref 8.6–12.4)
Monocytes Absolute: 0.6 10*3/uL (ref 0.1–1.0)
Monocytes Relative: 10 % (ref 3–12)
NEUTROS ABS: 3.6 10*3/uL (ref 1.7–7.7)
NEUTROS PCT: 64 % (ref 43–77)
Platelets: 180 10*3/uL (ref 150–400)
RBC: 4.55 MIL/uL (ref 4.22–5.81)
RDW: 13.3 % (ref 11.5–15.5)
WBC: 5.7 10*3/uL (ref 4.0–10.5)

## 2015-06-09 NOTE — Assessment & Plan Note (Signed)
Ear irrigation and disimpaction of cerumen complete today. Pt tolerated procedure well.

## 2015-06-09 NOTE — Assessment & Plan Note (Signed)
Vincent May is a 24 y.o. with recent travel to [place], presents for a acute office visit for possible viral versus bacterial illness. - Patient with signs and symptoms concerning for possible mosquito borne viral infectious disease. Patient and his wife are attempting to become pregnant, so this is concerning for Zika virus. There has been cases of Zika virus in [place] by [employer]. Patient's symptoms are arthralgias, myalgias and fever. He also has ear pain, and exam is consistent with erythema bilaterally with bulging tympanic membranes. Patient's onset of symptoms is 2 days. - Contacted [facility] health Department, and reviewed [employer], both recommending testing for dengue, Chikungunya and Zika.  - Patient advised to either abstain from sexual contact or use a condom until results are returned. If Zika positive will need to have wife tested and patient will need to use a condom for 6 months.  - Will obtain CBC with differential, Zika, Dengue, Chikungunya. [facility] to [facility] these drawn, patient will need to go to [address] to have these drawn.  25 minutes or more of face to face counseling with the patient.

## 2015-06-09 NOTE — Assessment & Plan Note (Signed)
Vincent May is a 23 y.o. with recent travel to [place], presents for a acute office visit for possible viral versus bacterial illness. - Patient with signs and symptoms concerning for possible mosquito borne viral infectious disease. Patient and his wife are attempting to become pregnant, so this is concerning for Zika virus. There has been cases of Zika virus in [place] by CDC website. Patient's symptoms are arthralgias, myalgias and fever. He also has ear pain, and exam is consistent with erythema bilaterally with bulging tympanic membranes. Patient's onset of symptoms is 2 days. - Contacted [place] health Department, and reviewed CDC website, both recommending testing for dengue, Chikungunya and Zika.  - Patient advised to either abstain from sexual contact or use a condom until results are returned. If Zika positive will need to have wife tested and patient will need to use a condom for 6 months.  - Will obtain CBC with differential, Zika, Dengue, Chikungunya. [facility] will [facility] to have these drawn, patient will need to go to [address] to have these drawn.  25 minutes or more of face to face counseling with the patient. 

## 2015-06-09 NOTE — Assessment & Plan Note (Signed)
Bilateral erythema and bulging of TM, no perforation. Will treat with Augmentin, maybe viral, but patient with systemic symptoms and fever.  F/u 2 weeks, sooner if no improvement

## 2015-06-09 NOTE — Telephone Encounter (Signed)
Discussed with patient today the recommendations from the [facility] and [place] health department is to collect Zika, Dengue and Chikungunya labs. Vouchers will be placed out at the front desk for him to take to [facility].

## 2015-06-10 ENCOUNTER — Other Ambulatory Visit: Payer: Self-pay | Admitting: Family Medicine

## 2015-06-13 LAB — DENGUE FEVER ABS, IGG, IGM
DENGUE FEVER ANTIBODIES (IGG,IGM): 0.14
Dengue Fever Ab (IgM): 0.08

## 2015-06-14 ENCOUNTER — Telehealth: Payer: Self-pay | Admitting: Family Medicine

## 2015-06-14 LAB — ZIKA VIRUS RNA,QUAL RT-PCR

## 2015-06-14 LAB — CHIKUNGUNYA ABS W/REFLEX TO TITER
Chikungunya IgG Screen: NEGATIVE
Chikungunya IgM Screen: NEGATIVE

## 2015-06-14 NOTE — Telephone Encounter (Signed)
LMOM for pt to CB.  

## 2015-06-14 NOTE — Telephone Encounter (Signed)
Patient aware of results.  He states [facility] did get in touch with him and he had zika lab redrawn 06/10/15.

## 2015-06-14 NOTE — Telephone Encounter (Signed)
Please call Vincent May, all of his labs were normal. I did not get the [place] panel results as of yet. To my knowledge the lab needed to recollect the specimen for Zika. If they had not made arrangements with him to recollect or if it has not been recollected, these arrangements need to be made. I instructed the lab to recollect when they called, please ensure this is occuring. If patient had not been contacted by the lab, please call the lab [facility] [place], [address]) and have them make arrangements to re-draw.  Thanks.

## 2015-06-15 ENCOUNTER — Telehealth: Payer: Self-pay | Admitting: Family Medicine

## 2015-06-15 NOTE — Telephone Encounter (Signed)
Spoke to [employer].  Lab takes 5-7 days.  It should be complete by 06/17/15.

## 2015-06-15 NOTE — Telephone Encounter (Signed)
Can you please contact lab and see when they expect Zika panel results. I have not received them, but I keep getting the results for the other viruses we checked. In those results it appears ZIka was cancelled, and I see no new order. Thanks.

## 2015-06-16 ENCOUNTER — Telehealth: Payer: Self-pay | Admitting: Family Medicine

## 2015-06-16 LAB — ZIKA VIRUS RNA,QUAL RT-PCR: ZIKA VIRUS RNA,QUAL RT-PCR: NOT DETECTED

## 2015-06-16 NOTE — Telephone Encounter (Signed)
Left detailed message on pt's cell.  Okay per DPR. 

## 2015-06-16 NOTE — Telephone Encounter (Signed)
Please call patient his ZIKA virus is negative

## 2015-06-23 ENCOUNTER — Ambulatory Visit (INDEPENDENT_AMBULATORY_CARE_PROVIDER_SITE_OTHER): Payer: BLUE CROSS/BLUE SHIELD | Admitting: Family Medicine

## 2015-06-23 ENCOUNTER — Encounter: Payer: Self-pay | Admitting: Family Medicine

## 2015-06-23 VITALS — BP 101/65 | HR 85 | Temp 97.6°F | Ht 70.0 in | Wt 154.8 lb

## 2015-06-23 DIAGNOSIS — H6692 Otitis media, unspecified, left ear: Secondary | ICD-10-CM | POA: Diagnosis not present

## 2015-06-23 DIAGNOSIS — Z9189 Other specified personal risk factors, not elsewhere classified: Secondary | ICD-10-CM | POA: Diagnosis not present

## 2015-06-23 DIAGNOSIS — J3089 Other allergic rhinitis: Secondary | ICD-10-CM | POA: Diagnosis not present

## 2015-06-23 DIAGNOSIS — J309 Allergic rhinitis, unspecified: Secondary | ICD-10-CM | POA: Insufficient documentation

## 2015-06-23 MED ORDER — FLUTICASONE PROPIONATE 50 MCG/ACT NA SUSP: 2.0000 | Freq: Every day | NASAL | Status: DC

## 2015-06-23 NOTE — Patient Instructions (Signed)
flonase nasal spray one time a day for 2-4 weeks, until ear popping improves.

## 2015-06-23 NOTE — Progress Notes (Signed)
Subjective:    Patient ID: Vincent May, male    DOB: [birth year], 24 y.o.   MRN: [id number]  HPI  Patient presents for follow-up office visit on otitis media and discussion follow-up on results of infectious disease labs.  Otitis Media: Patient states that he finished his antibiotics as directed, he feels that his ears are completely better. He denies any fever, chills, nausea, vomiting, diarrhea or rash. He denies any further ear pain or drainage. His appetite is normal. He has returned to his normal activities. He has noticed some popping/clicking noises especially in his right ear. He has not tried any in a histamine or nasal sprays.  Infectious disease risk, due to recent travel:  Patient denies development of any fever, chills, nausea, vomiting, diarrhea or rash. He denies any headaches. Patient is back to his normal energy level, no longer fatigued. Dengue,  Chikungunya and Zika panel negative.  Past Medical History  Diagnosis Date  . Fracture of left tibia     spiral, age 8  . Left wrist fracture    No Known Allergies No past surgical history on file. Family History  Problem Relation Age of Onset  . Hypertension Mother    Social History   Social History  . Marital Status: Single    Spouse Name: N/A  . Number of Children: 0  . 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 Smoking status: Never Smoker   . Smokeless tobacco: Never Used  . Alcohol Use: 1.2 oz/week    2 Cans of beer per week  . Drug Use: No  . Sexual Activity: Yes   Other Topics Concern  . Not on file   Social History Narrative   Pt lives in [place] with Wife.   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   Originally from [place], relocated 2009 to go to [facility] state.            Review of Systems Negative, with the exception of above mentioned in HPI     Objective:   Physical Exam BP 101/65 mmHg  Pulse 85   Temp(Src) 97.6 F (36.4 C)  Ht  (1.778 m)  Wt 154 lb 12.8 oz (70.217 kg)  BMI 22.21 kg/m2  SpO2 95% Gen: Afebrile. No acute distress. Nontoxic in appearance, well-developed, well-nourished male HENT: AT. [place]. Bilateral TM visualized shiny/full in appearance, no erythema or drainage. MMM. Bilateral nares mild erythema, no swelling. Throat without erythema or exudates.  Eyes:Pupils Equal Round Reactive to light, Extraocular movements intact,Conjunctiva without redness, discharge or icterus. Neck: Supple, no lymphadenopathy     Assessment & Plan:  Vincent May is a 23 y.o. male returns for follow on otitis media, mosquito-bourne illness and new allergic rhinitis.  1. Allergic rhinitis: - Discussed flonase use, he is to take for at least 4 weeks, longer if needed.  - fluticasone (FLONASE) 50 MCG/ACT nasal spray; Place 2 sprays into both nostrils daily.  Dispense: 16 g; Refill: 0  2. At risk for infectious disease due to recent foreign travel - discussed all lab work results today. Zika, Dengue and Chikungunya all negative.  - with negative Zika he can discontinue condom use, and it should be safe to resume attempts of having a child with his wife.  3. Acute left otitis media, recurrence not specified, unspecified otitis media type - completely resolved today. Allergy sx  remain, flonase prescribed.   F/U PRN

## 2015-12-06 ENCOUNTER — Telehealth: Payer: Self-pay | Admitting: *Deleted

## 2015-12-06 NOTE — Telephone Encounter (Signed)
3 attempts were made to contact pt in regards to the flu shot. Pt has not returned call. Flu vaccine will be declined for 2016.

## 2017-02-22 ENCOUNTER — Encounter: Payer: Self-pay | Admitting: Family Medicine

## 2017-02-22 ENCOUNTER — Ambulatory Visit (INDEPENDENT_AMBULATORY_CARE_PROVIDER_SITE_OTHER): Payer: 59 | Admitting: Family Medicine

## 2017-02-22 VITALS — BP 105/62 | HR 77 | Temp 98.1°F | Resp 16 | Ht 69.0 in | Wt 159.5 lb

## 2017-02-22 DIAGNOSIS — Z0000 Encounter for general adult medical examination without abnormal findings: Secondary | ICD-10-CM

## 2017-02-22 NOTE — Progress Notes (Signed)
Office Note 02/22/2017  CC:  Chief Complaint  Patient presents with  . Annual Exam    HPI:  Vincent May is a 26 y.o. male who is here for annual health maintenance exam.  Had eye exam 2 wks ago. Dental preventatives q21mo.  Exercise: 3 times a week, run or walk. Diet: trying to eat varied diet, trying to avoid fast food.   Past Medical History:  Diagnosis Date  . Fracture of left tibia    spiral, age 34  . Left wrist fracture     History reviewed. No pertinent surgical history.  Family History  Problem Relation Age of Onset  . Hypertension Mother     Social History   Social History  . Marital status: Single    Spouse name: N/A  . Number of children: 0  . Years of education: 4   Occupational History  . PGA golf [employer]   Social History Main Topics  . Smoking status: Never Smoker  . Smokeless tobacco: Never Used  . Alcohol use 1.2 oz/week    2 Cans of beer per week  . Drug use: No  . Sexual activity: Yes   Other Topics Concern  . Not on file   Social History Narrative   Pt lives in [place] with wife and 10 mo son.   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   As of 01/2017 he is unemployed, spending time with 10 mo infant.   Originally from [place], relocated 2009 to go to [facility] state.             Outpatient Medications Prior to Visit  Medication Sig Dispense Refill  . fluticasone (FLONASE) 50 MCG/ACT nasal spray Place 2 sprays into both nostrils daily. (Patient not taking: Reported on 02/22/2017) 16 g 0   No facility-administered medications prior to visit.     No Known Allergies  ROS Review of Systems  Constitutional: Negative for appetite change, chills, fatigue and fever.  HENT: Negative for congestion, dental problem, ear pain and sore throat.   Eyes: Negative for discharge, redness and visual disturbance.  Respiratory: Negative for cough, chest tightness, shortness of breath and wheezing.    Cardiovascular: Negative for chest pain, palpitations and leg swelling.  Gastrointestinal: Negative for abdominal pain, blood in stool, diarrhea, nausea and vomiting.  Genitourinary: Negative for difficulty urinating, dysuria, flank pain, frequency, hematuria and urgency.  Musculoskeletal: Negative for arthralgias, back pain, joint swelling, myalgias and neck stiffness.  Skin: Negative for pallor and rash.  Neurological: Negative for dizziness, speech difficulty, weakness and headaches.  Hematological: Negative for adenopathy. Does not bruise/bleed easily.  Psychiatric/Behavioral: Negative for confusion and sleep disturbance. The patient is not nervous/anxious.     PE; Blood pressure 105/62, pulse 77, temperature 98.1 F (36.7 C), temperature source Oral, resp. rate 16, height  (1.753 m), weight 159 lb 8 oz (72.3 kg), SpO2 97 %. Body mass index is 23.55 kg/m.  Gen: Alert, well appearing.  Patient is oriented to person, place, time, and situation. AFFECT: pleasant, lucid thought and speech. ENT: Ears: EACs clear, normal epithelium.  TMs with good light reflex and landmarks bilaterally.  Eyes: no injection, icteris, swelling, or exudate.  EOMI, PERRLA. Nose: no drainage or turbinate edema/swelling.  No injection or focal lesion.  Mouth: lips without lesion/swelling.  Oral mucosa pink and moist.  Dentition intact and without obvious caries or gingival swelling.  Oropharynx without erythema, exudate, or swelling.  Neck: supple/nontender.  No LAD, mass, or TM.  Carotid pulses 2+ bilaterally, without bruits. CV: RRR, no m/r/g.   LUNGS: CTA bilat, nonlabored resps, good aeration in all lung fields. ABD: soft, NT, ND, BS normal.  No hepatospenomegaly or mass.  No bruits. EXT: no clubbing, cyanosis, or edema.  Musculoskeletal: no joint swelling, erythema, warmth, or tenderness.  ROM of all joints intact. Skin - no sores or suspicious lesions or rashes or color changes   Pertinent labs:    Lab Results  Component Value Date   WBC 5.7 06/09/2015   HGB 13.4 06/09/2015   HCT 40.0 06/09/2015   MCV 87.9 06/09/2015   PLT 180 06/09/2015   Lab Results  Component Value Date   CREATININE 0.9 07/25/2013   BUN 12 07/25/2013   NA 138 07/25/2013   K 3.9 07/25/2013   CL 102 07/25/2013   CO2 31 07/25/2013   Lab Results  Component Value Date   ALT 24 07/25/2013   AST 16 07/25/2013   ALKPHOS 43 07/25/2013   BILITOT 2.7 (H) 07/25/2013    ASSESSMENT AND PLAN:   Health maintenance exam: Reviewed age and gender appropriate health maintenance issues (prudent diet, regular exercise, health risks of tobacco and excessive alcohol, use of seatbelts, fire alarms in home, use of sunscreen).  Also reviewed age and gender appropriate health screening as well as vaccine recommendations. Tdap UTD. Declines Hep A #2 today.  An After Visit Summary was printed and given to the patient.  FOLLOW UP:  Return for Return in 1-2 years for CPE.  Signed:  Santiago Bumpers, MD           02/22/2017

## 2017-02-22 NOTE — Patient Instructions (Signed)
 Health Maintenance, Male A healthy lifestyle and preventive care is important for your health and wellness. Ask your health care provider about what schedule of regular examinations is right for you. What should I know about weight and diet?  Eat a Healthy Diet  Eat plenty of vegetables, fruits, whole grains, low-fat dairy products, and lean protein.  Do not eat a lot of foods high in solid fats, added sugars, or salt. Maintain a Healthy Weight  Regular exercise can help you achieve or maintain a healthy weight. You should:  Do at least 150 minutes of exercise each week. The exercise should increase your heart rate and make you sweat (moderate-intensity exercise).  Do strength-training exercises at least twice a week. Watch Your Levels of Cholesterol and Blood Lipids  Have your blood tested for lipids and cholesterol every 5 years starting at 28 years of age. If you are at high risk for heart disease, you should start having your blood tested when you are 28 years old. You may need to have your cholesterol levels checked more often if:  Your lipid or cholesterol levels are high.  You are older than 28 years of age.  You are at high risk for heart disease. What should I know about cancer screening? Many types of cancers can be detected early and may often be prevented. Lung Cancer  You should be screened every year for lung cancer if:  You are a current smoker who has smoked for at least 30 years.  You are a former smoker who has quit within the past 15 years.  Talk to your health care provider about your screening options, when you should start screening, and how often you should be screened. Colorectal Cancer  Routine colorectal cancer screening usually begins at 28 years of age and should be repeated every 5-10 years until you are 28 years old. You may need to be screened more often if early forms of precancerous polyps or small growths are found. Your health care provider  may recommend screening at an earlier age if you have risk factors for colon cancer.  Your health care provider may recommend using home test kits to check for hidden blood in the stool.  A small camera at the end of a tube can be used to examine your colon (sigmoidoscopy or colonoscopy). This checks for the earliest forms of colorectal cancer. Prostate and Testicular Cancer  Depending on your age and overall health, your health care provider may do certain tests to screen for prostate and testicular cancer.  Talk to your health care provider about any symptoms or concerns you have about testicular or prostate cancer. Skin Cancer  Check your skin from head to toe regularly.  Tell your health care provider about any new moles or changes in moles, especially if:  There is a change in a mole's size, shape, or color.  You have a mole that is larger than a pencil eraser.  Always use sunscreen. Apply sunscreen liberally and repeat throughout the day.  Protect yourself by wearing long sleeves, pants, a wide-brimmed hat, and sunglasses when outside. What should I know about heart disease, diabetes, and high blood pressure?  If you are 18-39 years of age, have your blood pressure checked every 3-5 years. If you are 40 years of age or older, have your blood pressure checked every year. You should have your blood pressure measured twice-once when you are at a hospital or clinic, and once when you are not at   a hospital or clinic. Record the average of the two measurements. To check your blood pressure when you are not at a hospital or clinic, you can use:  An automated blood pressure machine at a pharmacy.  A home blood pressure monitor.  Talk to your health care provider about your target blood pressure.  If you are between 45-79 years old, ask your health care provider if you should take aspirin to prevent heart disease.  Have regular diabetes screenings by checking your fasting blood sugar  level.  If you are at a normal weight and have a low risk for diabetes, have this test once every three years after the age of 45.  If you are overweight and have a high risk for diabetes, consider being tested at a younger age or more often.  A one-time screening for abdominal aortic aneurysm (AAA) by ultrasound is recommended for men aged 65-75 years who are current or former smokers. What should I know about preventing infection? Hepatitis B  If you have a higher risk for hepatitis B, you should be screened for this virus. Talk with your health care provider to find out if you are at risk for hepatitis B infection. Hepatitis C  Blood testing is recommended for:  Everyone born from 1945 through 1965.  Anyone with known risk factors for hepatitis C. Sexually Transmitted Diseases (STDs)  You should be screened each year for STDs including gonorrhea and chlamydia if:  You are sexually active and are younger than 28 years of age.  You are older than 28 years of age and your health care provider tells you that you are at risk for this type of infection.  Your sexual activity has changed since you were last screened and you are at an increased risk for chlamydia or gonorrhea. Ask your health care provider if you are at risk.  Talk with your health care provider about whether you are at high risk of being infected with HIV. Your health care provider may recommend a prescription medicine to help prevent HIV infection. What else can I do?  Schedule regular health, dental, and eye exams.  Stay current with your vaccines (immunizations).  Do not use any tobacco products, such as cigarettes, chewing tobacco, and e-cigarettes. If you need help quitting, ask your health care provider.  Limit alcohol intake to no more than 2 drinks per day. One drink equals 12 ounces of beer, 5 ounces of wine, or 1 ounces of hard liquor.  Do not use street drugs.  Do not share needles.  Ask your health  care provider for help if you need support or information about quitting drugs.  Tell your health care provider if you often feel depressed.  Tell your health care provider if you have ever been abused or do not feel safe at home. This information is not intended to replace advice given to you by your health care provider. Make sure you discuss any questions you have with your health care provider. Document Released: 04/13/2008 Document Revised: 06/14/2016 Document Reviewed: 07/20/2015 [employer] Interactive Patient Education  2017 [employer]  

## 2017-02-22 NOTE — Progress Notes (Signed)
Pre visit review using our clinic review tool, if applicable. No additional management support is needed unless otherwise documented below in the visit note. 

## 2017-09-17 NOTE — Progress Notes (Signed)
Mr. Vincent May received his flu shot on 09/14/17 at the [facility] to LT deltoid. Lot # [id number] NDC: [id number] Mfg: [employer] Expires: 04/28/18
